# Patient Record
Sex: Male | Born: 1995 | Race: White | Hispanic: No | Marital: Single | Smoking: Never smoker
Health system: Southern US, Community
[De-identification: ages and names within clinical notes are randomized; demographics above are authoritative.]

---

## 2018-05-23 ENCOUNTER — Other Ambulatory Visit: Payer: Self-pay

## 2018-05-23 ENCOUNTER — Emergency Department (HOSPITAL_COMMUNITY)
Admission: EM | Admit: 2018-05-23 | Discharge: 2018-05-23 | Disposition: A | Discharge status: Home / Self Care | Payer: POS | Attending: Emergency Medicine | Admitting: Emergency Medicine

## 2018-05-23 ENCOUNTER — Emergency Department (HOSPITAL_COMMUNITY): Payer: POS

## 2018-05-23 ENCOUNTER — Encounter (HOSPITAL_COMMUNITY): Payer: Self-pay | Admitting: Emergency Medicine

## 2018-05-23 DIAGNOSIS — T17320A Food in larynx causing asphyxiation, initial encounter: Secondary | ICD-10-CM

## 2018-05-23 DIAGNOSIS — R0989 Other specified symptoms and signs involving the circulatory and respiratory systems: Secondary | ICD-10-CM | POA: Insufficient documentation

## 2018-05-23 DIAGNOSIS — R062 Wheezing: Secondary | ICD-10-CM | POA: Diagnosis not present

## 2018-05-23 DIAGNOSIS — Z91010 Allergy to peanuts: Secondary | ICD-10-CM | POA: Insufficient documentation

## 2018-05-23 MED ORDER — ALBUTEROL SULFATE (2.5 MG/3ML) 0.083% IN NEBU
5.0000 mg | INHALATION_SOLUTION | Freq: Once | RESPIRATORY_TRACT | Status: AC
  Administered 2018-05-23: 5 mg via RESPIRATORY_TRACT
  Filled 2018-05-23: qty 6

## 2018-05-23 MED ORDER — ALBUTEROL SULFATE HFA 108 (90 BASE) MCG/ACT IN AERS
2.0000 | INHALATION_SPRAY | Freq: Once | RESPIRATORY_TRACT | Status: AC
  Administered 2018-05-23: 2 via RESPIRATORY_TRACT
  Filled 2018-05-23: qty 6.7

## 2018-05-23 MED ORDER — AEROCHAMBER PLUS FLO-VU LARGE MISC
1.0000 | Freq: Once | Status: AC
  Administered 2018-05-23: 1

## 2018-05-23 NOTE — ED Triage Notes (Signed)
Pt presents with sudden onset SOB when he accidentally choked while eating hotdog; does not believe there is a food bolus stuck; no wheezing noted in lung fields; pt aware of allergy to peanuts however does not feel this is an allergic reaction, no stridor or hives noted

## 2018-05-23 NOTE — ED Notes (Signed)
Patient verbalizes understanding of discharge instructions. Opportunity for questioning and answers were provided. Armband removed by staff, pt discharged from ED.  

## 2018-05-23 NOTE — ED Provider Notes (Signed)
[facility] EMERGENCY DEPARTMENT Provider Note   CSN: [id number] Arrival date & time: 05/23/18  1457     History   Chief Complaint Chief Complaint  Patient presents with  . Shortness of Breath    HPI Jeremiah Cantrell is a 22 y.o. male with a h/o of asthma who presents to the emergency department with a chief complaint of cough.  The patient reports that he was eating a hot dog at work when he accidentally choked and had a coughing fit that lasted for approximately 15 to 20 minutes with associated dyspnea.  He states that after the coughing resolved and that he noticed some wheezing in his lungs.  He denies chest pain, dysphagia, feeling as if his throat is closing, swelling of the tongue or lips, or globus sensation.  He reports that he was given a breathing treatment upon arrival to the emergency department and his wheezing and shortness of breath has resolved.  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  No recent peanut ingestion.  He reports that he has been feeling well and at his baseline prior to the onset of symptoms today.  The history is provided by the patient. No language interpreter was used.    History reviewed. No pertinent past medical history.  There are no active problems to display for this patient.   History reviewed. No pertinent surgical history.      Home Medications    Prior to Admission medications   Medication Sig Start Date End Date Taking? Authorizing Provider  ibuprofen (ADVIL,MOTRIN) 200 MG tablet Take 200-400 mg by mouth every 6 (six) hours as needed for headache or mild pain.   Yes [provider]    Family History History reviewed. No pertinent family history.  Social History Social History   Tobacco Use  . Smoking status: Never Smoker  Substance Use Topics  . Alcohol use: Not Currently    Comment: rare  . Drug use: Not Currently     Allergies   Peanut-containing drug products and Soy  allergy   Review of Systems Review of Systems  Constitutional: Negative for appetite change, chills and fever.  HENT: Negative for drooling, facial swelling, sore throat and trouble swallowing.   Eyes: Negative for visual disturbance.  Respiratory: Positive for cough, choking, shortness of breath and wheezing.   Cardiovascular: Negative for chest pain, palpitations and leg swelling.  Gastrointestinal: Negative for abdominal pain, diarrhea, nausea and vomiting.  Genitourinary: Negative for dysuria.  Musculoskeletal: Negative for back pain.  Skin: Negative for rash.  Allergic/Immunologic: Negative for immunocompromised state.  Neurological: Negative for syncope, weakness and headaches.  Psychiatric/Behavioral: Negative for confusion.   Physical Exam Updated Vital Signs BP 135/79   Pulse (!) 114   Temp 98.8 F (37.1 C) (Oral)   Resp 19   Ht 6' (1.829 m)   Wt 83.9 kg   SpO2 98%   BMI 25.09 kg/m   Physical Exam  Constitutional: He appears well-developed. No distress.  HENT:  Head: Normocephalic.  Mouth/Throat: Uvula is midline, oropharynx is clear and moist and mucous membranes are normal. No trismus in the jaw. No uvula swelling. No oropharyngeal exudate, posterior oropharyngeal edema, posterior oropharyngeal erythema or tonsillar abscesses. No tonsillar exudate.  Eyes: Conjunctivae are normal.  Neck: Normal range of motion. Neck supple.  Cardiovascular: Regular rhythm, normal heart sounds and intact distal pulses. Tachycardia present. Exam reveals no gallop and no friction rub.  No murmur heard. Pulmonary/Chest: Effort normal. No stridor. No respiratory  distress. He has no wheezes. He has no rales. He exhibits no tenderness.  Speaks in complete, fluent sentences.  Abdominal: Soft. He exhibits no distension and no mass. There is no tenderness. There is no rebound and no guarding. No hernia.  Lymphadenopathy:    He has no cervical adenopathy.  Neurological: He is alert.   Skin: Skin is warm and dry. He is not diaphoretic.  Psychiatric: His behavior is normal.  Nursing note and vitals reviewed.  ED Treatments / Results  Labs (all labs ordered are listed, but only abnormal results are displayed) Labs Reviewed - No data to display  EKG None  Radiology Dg Chest 2 View  Result Date: 05/23/2018 CLINICAL DATA:  Shortness of breath. EXAM: CHEST - 2 VIEW COMPARISON:  None. FINDINGS: The heart size and mediastinal contours are within normal limits. There is no focal infiltrate, pulmonary edema, or pleural effusion. The visualized skeletal structures are unremarkable. IMPRESSION: No active cardiopulmonary disease. Electronically Signed   By: Sherian ReinWei-Chen  Lin M.D.   On: 05/23/2018 16:45    Procedures Procedures (including critical care time)  Medications Ordered in ED Medications  albuterol (PROVENTIL) (2.5 MG/3ML) 0.083% nebulizer solution 5 mg (5 mg Nebulization Given 05/23/18 1534)  albuterol (PROVENTIL HFA;VENTOLIN HFA) 108 (90 Base) MCG/ACT inhaler 2 puff (2 puffs Inhalation Given 05/23/18 1724)  AEROCHAMBER PLUS FLO-VU LARGE MISC 1 each (1 each Other Given 05/23/18 1724)     Initial Impression / Assessment and Plan / ED Course  I have reviewed the triage vital signs and the nursing notes.  Pertinent labs & imaging results that were available during my care of the patient were reviewed by me and considered in my medical decision making (see chart for details).     22 year old male with history of childhood asthma presenting with coughing fits and dyspnea after a choking episode on a hot dog prior to arrival while at work.  He was given a nebulizer treatment by triage staff.  All symptoms have resolved at this time.  Heart rate 100 on arrival, increasing to 110-120s likely secondary to albuterol.  He is otherwise hemodynamically stable.  Chest x-ray is clear and without evidence of aspiration.  He has been drinking and swallowing without difficulty in the ED.   Doubt esophageal obstruction.  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  Recent peanut consumption.  The patient was seen and evaluated by Dr. Adriana Simasook, attending physician.  Will discharge the patient with a spacer and albuterol inhaler given his history of asthma otherwise suspect his symptoms today were secondary to choking.  Strict return precautions given.  He is hemodynamically stable and in no acute distress.  He is safe for discharge to home with outpatient follow-up at this time.  Final Clinical Impressions(s) / ED Diagnoses   Final diagnoses:  Choking due to food in larynx, initial encounter  Wheezing    ED Discharge Orders    None       Barkley BoardsMcDonald, Wendelin Bradt A, PA-C 05/23/18 1800    Donnetta Hutchingook, Brian, MD 05/24/18 1728

## 2018-05-23 NOTE — Discharge Instructions (Signed)
Thank you for allowing me to care for you today in the Emergency Department.   Take 2 puffs of your albuterol inhaler with the spacer if you develop wheezing or chest tightness.   Return to the emergency department if you develop wheezing that does not improve with the albuterol inhaler, if you have another prolonged coughing fits, this feeling as if your throat is closing, or if you develop swelling to your lips or tongue.

## 2019-05-20 IMAGING — CR DG CHEST 2V
2 series · 2 of 2 positions shown · non-contrast
Comparison: None.

CLINICAL DATA: Shortness of breath.

EXAM:
CHEST - 2 VIEW

[chest pa]
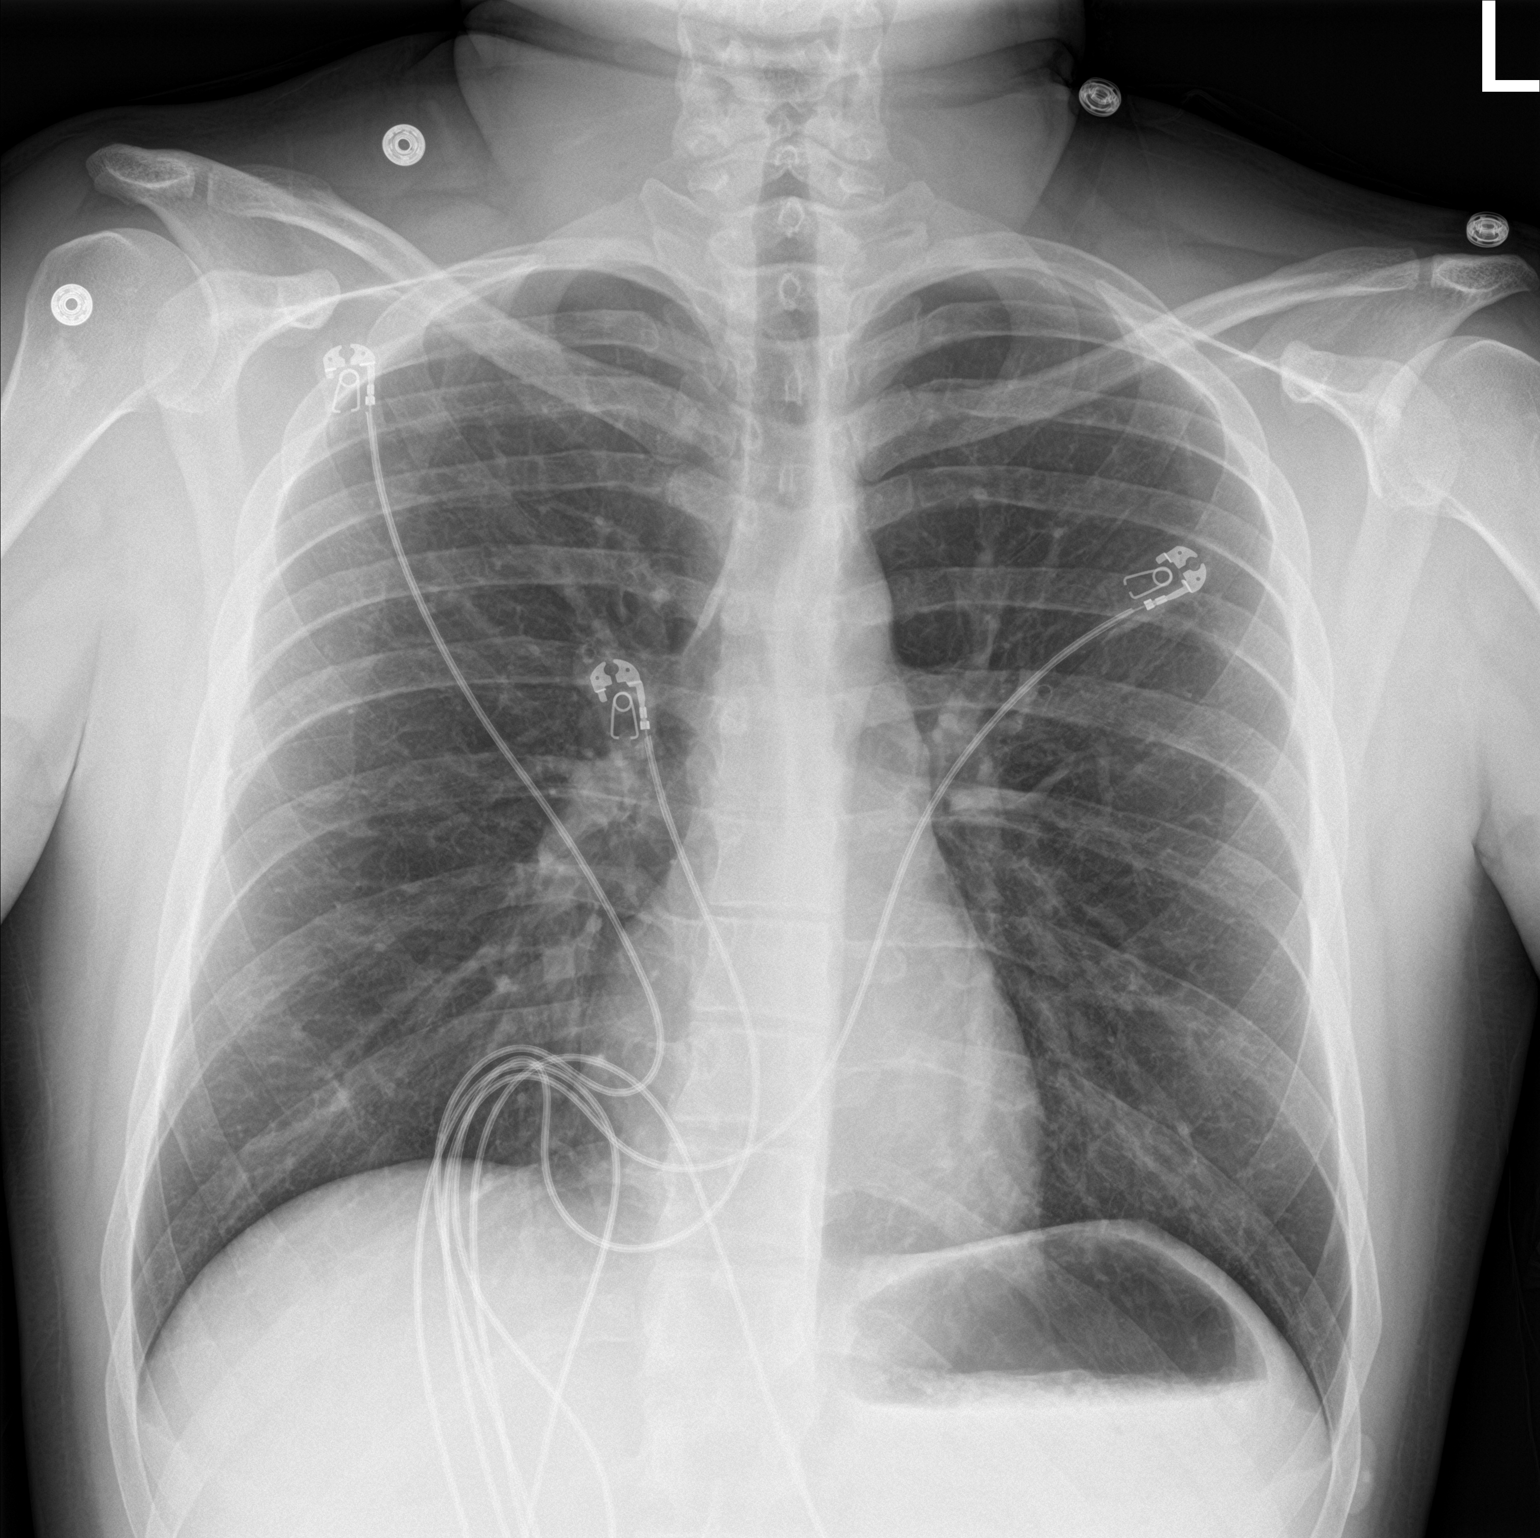

[chest lat]
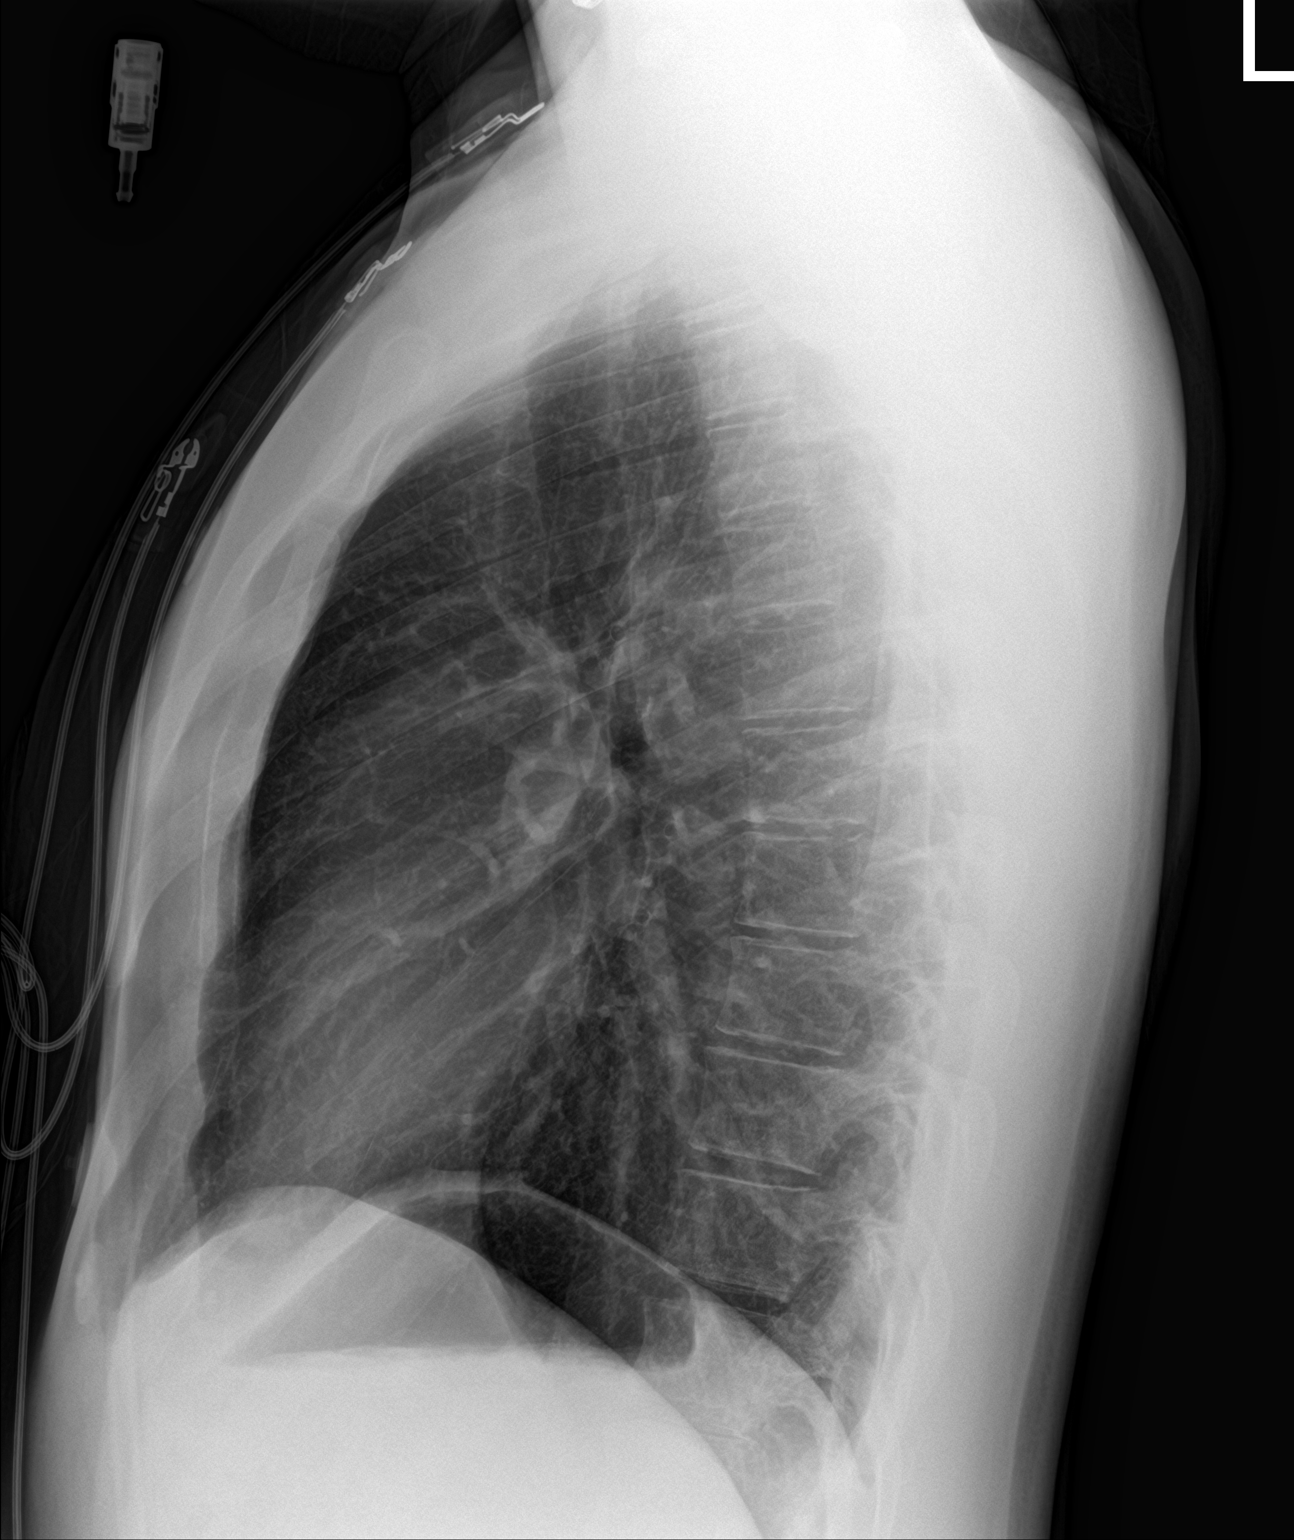

[2 of 2 positions shown; findings below may reference images not displayed]

FINDINGS: The heart size and mediastinal contours are within normal limits.
There is no focal infiltrate, pulmonary edema, or pleural effusion.
The visualized skeletal structures are unremarkable.
IMPRESSION: No active cardiopulmonary disease.
